# Patient Record
Sex: Male | Born: 1980 | Race: Black or African American | Hispanic: No | Marital: Single | Smoking: Never smoker
Health system: Southern US, Community
[De-identification: ages and names within clinical notes are randomized; demographics above are authoritative.]

## PROBLEM LIST (undated history)

## (undated) DIAGNOSIS — S069XAA Unspecified intracranial injury with loss of consciousness status unknown, initial encounter: Secondary | ICD-10-CM

---

## 2010-05-27 ENCOUNTER — Emergency Department (HOSPITAL_COMMUNITY): Admission: EM | Admit: 2010-05-27 | Discharge: 2010-05-28 | Payer: Self-pay | Admitting: Nurse Practitioner
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
## 2020-04-06 ENCOUNTER — Ambulatory Visit: Payer: Medicare Other | Attending: Family

## 2020-04-06 DIAGNOSIS — Z23 Encounter for immunization: Secondary | ICD-10-CM

## 2020-04-06 NOTE — Progress Notes (Signed)
   Covid-19 Vaccination Clinic  Name:  Jamieon Lannen    MRN: [id number] DOB: 04-Apr-1981  04/06/2020  Mr. Biela was observed post Covid-19 immunization for 15 minutes without incident. He was provided with Vaccine Information Sheet and instruction to access the V-Safe system.   Mr. Molzahn was instructed to call 911 with any severe reactions post vaccine: Marland Kitchen Difficulty breathing  . Swelling of face and throat  . A fast heartbeat  . A bad rash all over body  . Dizziness and weakness   Immunizations Administered    Name Date Dose VIS Date Route   Moderna COVID-19 Vaccine 04/06/2020  1:14 PM 0.5 mL 11/2019 Intramuscular   Manufacturer: Moderna   Lot: [id number]   NDC: 19914-445-84

## 2021-07-14 ENCOUNTER — Ambulatory Visit (INDEPENDENT_AMBULATORY_CARE_PROVIDER_SITE_OTHER): Payer: Medicare (Managed Care) | Admitting: Orthopedic Surgery

## 2021-07-14 ENCOUNTER — Other Ambulatory Visit: Payer: Self-pay

## 2021-07-14 ENCOUNTER — Encounter: Payer: Self-pay | Admitting: Orthopedic Surgery

## 2021-07-14 DIAGNOSIS — M21372 Foot drop, left foot: Secondary | ICD-10-CM

## 2021-07-19 ENCOUNTER — Encounter: Payer: Self-pay | Admitting: Orthopedic Surgery
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
## 2023-03-15 ENCOUNTER — Ambulatory Visit: Payer: Medicare (Managed Care) | Attending: Physical Therapy | Admitting: Physical Therapy
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
## 2023-03-19 ENCOUNTER — Ambulatory Visit: Payer: 59 | Attending: Nurse Practitioner

## 2023-03-19 DIAGNOSIS — R278 Other lack of coordination: Secondary | ICD-10-CM | POA: Diagnosis present

## 2023-03-19 DIAGNOSIS — R293 Abnormal posture: Secondary | ICD-10-CM | POA: Diagnosis present

## 2023-03-19 DIAGNOSIS — R2689 Other abnormalities of gait and mobility: Secondary | ICD-10-CM | POA: Insufficient documentation

## 2023-03-19 DIAGNOSIS — R262 Difficulty in walking, not elsewhere classified: Secondary | ICD-10-CM | POA: Insufficient documentation
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
## 2023-03-26 ENCOUNTER — Ambulatory Visit: Payer: 59 | Admitting: Physical Therapy

## 2023-03-26 ENCOUNTER — Encounter: Payer: Self-pay | Admitting: Physical Therapy

## 2023-03-26 DIAGNOSIS — R293 Abnormal posture: Secondary | ICD-10-CM | POA: Diagnosis not present

## 2023-03-26 DIAGNOSIS — R2689 Other abnormalities of gait and mobility: Secondary | ICD-10-CM

## 2023-03-26 DIAGNOSIS — R262 Difficulty in walking, not elsewhere classified: Secondary | ICD-10-CM

## 2023-03-26 DIAGNOSIS — R278 Other lack of coordination: Secondary | ICD-10-CM

## 2023-03-26 NOTE — Patient Instructions (Signed)
Access Code: [email or web address] URL: [email or web address] Date: 03/26/2023 Prepared by: Camille Bal  Exercises - Sit to Stand with Counter Support  - 1 x daily - 5 x weekly - 3 sets - 10 reps - Seated Long Arc Quad  - 1 x daily - 5 x weekly - 2 sets - 10 reps - 2 seconds hold - Seated Knee Flexion  - 1 x daily - 5 x weekly - 2 sets - 10 reps
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
## 2023-04-02 ENCOUNTER — Ambulatory Visit: Payer: 59 | Admitting: Physical Therapy

## 2023-04-02 DIAGNOSIS — R293 Abnormal posture: Secondary | ICD-10-CM

## 2023-04-02 DIAGNOSIS — R262 Difficulty in walking, not elsewhere classified: Secondary | ICD-10-CM

## 2023-04-02 DIAGNOSIS — R2689 Other abnormalities of gait and mobility: Secondary | ICD-10-CM
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
## 2023-04-09 ENCOUNTER — Ambulatory Visit: Payer: 59

## 2023-04-09 DIAGNOSIS — R278 Other lack of coordination: Secondary | ICD-10-CM

## 2023-04-09 DIAGNOSIS — R293 Abnormal posture: Secondary | ICD-10-CM | POA: Diagnosis not present

## 2023-04-09 DIAGNOSIS — R262 Difficulty in walking, not elsewhere classified: Secondary | ICD-10-CM

## 2023-04-09 DIAGNOSIS — R2689 Other abnormalities of gait and mobility: Secondary | ICD-10-CM
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
## 2023-04-16 ENCOUNTER — Ambulatory Visit: Payer: 59 | Admitting: Physical Therapy

## 2023-04-16 DIAGNOSIS — R293 Abnormal posture: Secondary | ICD-10-CM | POA: Diagnosis not present

## 2023-04-16 DIAGNOSIS — R2689 Other abnormalities of gait and mobility: Secondary | ICD-10-CM

## 2023-04-16 DIAGNOSIS — R262 Difficulty in walking, not elsewhere classified: Secondary | ICD-10-CM

## 2023-04-16 DIAGNOSIS — R278 Other lack of coordination: Secondary | ICD-10-CM
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
## 2023-04-23 ENCOUNTER — Ambulatory Visit: Payer: 59 | Attending: Nurse Practitioner

## 2023-04-23 DIAGNOSIS — R293 Abnormal posture: Secondary | ICD-10-CM | POA: Insufficient documentation

## 2023-04-23 DIAGNOSIS — R2689 Other abnormalities of gait and mobility: Secondary | ICD-10-CM

## 2023-04-23 DIAGNOSIS — R278 Other lack of coordination: Secondary | ICD-10-CM | POA: Insufficient documentation

## 2023-04-23 DIAGNOSIS — R262 Difficulty in walking, not elsewhere classified: Secondary | ICD-10-CM | POA: Insufficient documentation
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
## 2023-04-30 ENCOUNTER — Ambulatory Visit: Payer: 59 | Admitting: Physical Therapy

## 2023-05-07 ENCOUNTER — Ambulatory Visit: Payer: 59 | Admitting: Physical Therapy

## 2023-05-21 ENCOUNTER — Ambulatory Visit: Payer: 59 | Admitting: Physical Therapy

## 2025-01-22 ENCOUNTER — Emergency Department (HOSPITAL_COMMUNITY)
Admission: EM | Admit: 2025-01-22 | Discharge: 2025-01-22 | Disposition: A | Discharge status: Skilled Nursing Facility | Source: Home / Self Care | Attending: Emergency Medicine | Admitting: Emergency Medicine

## 2025-01-22 ENCOUNTER — Encounter (HOSPITAL_COMMUNITY): Payer: Self-pay | Admitting: *Deleted

## 2025-01-22 ENCOUNTER — Other Ambulatory Visit: Payer: Self-pay

## 2025-01-22 ENCOUNTER — Emergency Department (HOSPITAL_COMMUNITY)

## 2025-01-22 DIAGNOSIS — W19XXXA Unspecified fall, initial encounter: Secondary | ICD-10-CM

## 2025-01-22 DIAGNOSIS — S01111A Laceration without foreign body of right eyelid and periocular area, initial encounter: Secondary | ICD-10-CM

## 2025-01-22 HISTORY — DX: Unspecified intracranial injury with loss of consciousness status unknown, initial encounter: S06.9XAA

## 2025-01-22 MED ORDER — TETANUS-DIPHTH-ACELL PERTUSSIS 5-2-15.5 LF-MCG/0.5 IM SUSP
0.5000 mL | Freq: Once | INTRAMUSCULAR | Status: AC
  Administered 2025-01-22: 0.5 mL via INTRAMUSCULAR
  Filled 2025-01-22: qty 0.5

## 2025-01-22 MED ORDER — LIDOCAINE-EPINEPHRINE (PF) 2 %-1:200000 IJ SOLN
10.0000 mL | Freq: Once | INTRAMUSCULAR | Status: AC
  Administered 2025-01-22: 10 mL
  Filled 2025-01-22: qty 20

## 2025-01-22 NOTE — Discharge Instructions (Signed)
 Keep stitches clean and dry.  Follow-up in 7 days to have sutures removed.  Return to ED if any symptoms worsen including uncontrolled bleeding from the wound, uncontrolled headache, syncopal episode, signs of infection from the wound.

## 2025-01-22 NOTE — ED Provider Notes (Signed)
 " [facility] Provider Note   CSN: [id number] Arrival date & time: 01/22/25  9062     Patient presents with: Justin Wilkerson   Justin Wilkerson is a 44 y.o. male.  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  Patient notes he tripped in his room and hit his head on the sink.  Denies loss of consciousness or any nausea/vomiting.  He is currently staying at [facility].  He notes that the area was still bleeding and was told he may need stitches so sent to the ED.  States he is having minimal pain but otherwise feels normal.  No further complaints.   Fall Pertinent negatives include no chest pain, no headaches and no shortness of breath.       Prior to Admission medications  Not on File    Allergies: Patient has no known allergies.    Review of Systems  Constitutional:  Negative for chills and fever.  Respiratory:  Negative for shortness of breath.   Cardiovascular:  Negative for chest pain.  Skin:  Positive for wound.  Neurological:  Negative for dizziness, syncope and headaches.  All other systems reviewed and are negative.   Updated Vital Signs BP 127/87   Pulse (!) 53   Temp 97.9 F (36.6 C)   Resp 16   Wt 83.9 kg   SpO2 100%   Physical Exam Constitutional:      Appearance: Normal appearance.  HENT:     Head: Normocephalic.     Comments: 1 cm laceration present to the right lateral eye.  Minimal active bleeding.  Steri-Strips in place. Eyes:     Extraocular Movements: Extraocular movements intact.     Pupils: Pupils are equal, round, and reactive to light.     Comments: No pain with EOM.  EOM intact with no difficulty.  Cardiovascular:     Rate and Rhythm: Normal rate.  Pulmonary:     Effort: Pulmonary effort is normal.  Skin:    General: Skin is warm and dry.  Neurological:     Mental Status: He is alert and oriented to  person, place, and time.  Psychiatric:     Comments: At baseline     (all labs ordered are listed, but only abnormal results are displayed) Labs Reviewed - No data to display  EKG: None  Radiology: CT Cervical Spine Wo Contrast Result Date: 01/22/2025 EXAM: CT CERVICAL SPINE WITHOUT CONTRAST 01/22/2025 02:31:00 PM TECHNIQUE: CT of the cervical spine was performed without the administration of intravenous contrast. Multiplanar reformatted images are provided for review. Automated exposure control, iterative reconstruction, and/or weight based adjustment of the mA/kV was utilized to reduce the radiation dose to as low as reasonably achievable. COMPARISON: None available. CLINICAL HISTORY: fall Fall. FINDINGS: BONES AND ALIGNMENT: No acute fracture or traumatic malalignment. There is a metallic spinal stimulator lead noted within the upper thoracic spinal canal extending into the lower cervical spinal canal. The stimulator lead tip is within the left ventral aspect of the spinal canal at the C7-T1 level. DEGENERATIVE CHANGES: Degenerative endplate osteophytes and endplate sclerosis at multiple levels. There is disc space narrowing throughout the cervical spine most pronounced at C7-T1. Disc osteophyte complexes at multiple levels. There is no high grade osseous spinal canal stenosis. Facet arthrosis and no uncovertebral hypertrophy. There is foraminal stenosis at multiple levels throughout  the cervical spine most pronounced at the C4-C5 and C5-C6 levels. SOFT TISSUES: No prevertebral soft tissue swelling. IMPRESSION: 1. No evidence of acute traumatic injury. 2. Metallic spinal stimulator lead within the upper thoracic spinal canal, with the tip at the left ventral aspect of the spinal canal at the C7-T1 level. Electronically signed by: Donnice Mania MD 01/22/2025 03:05 PM EST RP Workstation: [id number]   CT Maxillofacial Wo Contrast Result Date: 01/22/2025 EXAM: CT OF THE FACE WITHOUT CONTRAST 01/22/2025  02:31:00 PM TECHNIQUE: CT of the face was performed without the administration of intravenous contrast. Multiplanar reformatted images are provided for review. Automated exposure control, iterative reconstruction, and/or weight based adjustment of the mA/kV was utilized to reduce the radiation dose to as low as reasonably achievable. COMPARISON: None available. CLINICAL HISTORY: Fall. FINDINGS: FACIAL BONES: Age indeterminate minimally displaced nasal bone fractures. No additional maxillofacial fractures noted. No mandibular dislocation. No suspicious bone lesion. ORBITS: Globes are intact. No acute traumatic injury. No inflammatory change. There is right periorbital soft tissue swelling particularly along the lateral aspect of the orbit. SINUSES AND MASTOIDS: No acute abnormality. SOFT TISSUES: There is additional soft tissue swelling in the right facial soft tissues over the zygomatic arch and maxillary sinus on the right. IMPRESSION: 1. Age indeterminate minimally displaced nasal bone fractures. 2. Right periorbital soft tissue swelling. Additional soft tissue swelling in the right facial soft tissues over the zygomatic arch and maxillary sinus. Electronically signed by: Donnice Mania MD 01/22/2025 02:59 PM EST RP Workstation: [id number]   CT Head Wo Contrast Result Date: 01/22/2025 EXAM: CT HEAD WITHOUT CONTRAST 01/22/2025 02:31:00 PM TECHNIQUE: CT of the head was performed without the administration of intravenous contrast. Automated exposure control, iterative reconstruction, and/or weight based adjustment of the mA/kV was utilized to reduce the radiation dose to as low as reasonably achievable. COMPARISON: 05/27/2010 CLINICAL HISTORY: Fall. FINDINGS: BRAIN AND VENTRICLES: No acute hemorrhage. No evidence of acute infarct. Encephalomalacia in the perisagittal right frontal lobe involving a portion of the right cingulate gyrus as well as the posterior body of the corpus callosum. This finding is suggestive of  a remote infarct which is new since 2011. Bilateral basal ganglia mineralization. No hydrocephalus. No extra-axial collection. No mass effect or midline shift. ORBITS: No acute abnormality. SINUSES: No acute abnormality. SOFT TISSUES AND SKULL: No acute soft tissue abnormality. No skull fracture. IMPRESSION: 1. No acute intracranial abnormality. 2. 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 Electronically signed by: Donnice Mania MD 01/22/2025 02:53 PM EST RP Workstation: [id number]     .Laceration Repair  Date/Time: 01/22/2025 3:54 PM  Performed by: Neysa Thersia RAMAN, PA-C Authorized by: Neysa Thersia RAMAN, PA-C   Consent:    Consent obtained:  Verbal   Consent given by:  Patient   Risks discussed:  Infection Universal protocol:    Patient identity confirmed:  Verbally with patient Anesthesia:    Anesthesia method:  Local infiltration   Local anesthetic:  Lidocaine  2% WITH epi Laceration details:    Location:  Face   Face location:  R eyebrow   Length (cm):  2   Depth (mm):  1 Exploration:    Hemostasis achieved with:  Direct pressure and epinephrine    Contaminated: no   Treatment:    Area cleansed with:  Povidone-iodine   Amount of cleaning:  Standard   Irrigation solution:  Sterile saline   Irrigation volume:  20cc  Irrigation method:  Syringe   Debridement:  None Skin repair:    Repair method:  Sutures   Suture size:  4-0   Suture material:  Prolene   Number of sutures:  2 Approximation:    Approximation:  Close Repair type:    Repair type:  Simple Post-procedure details:    Dressing:  Non-adherent dressing   Procedure completion:  Tolerated well, no immediate complications    Medications Ordered in the ED  lidocaine -EPINEPHrine  (XYLOCAINE  W/EPI) 2 %-1:200000 (PF) injection 10 mL (has no administration in time range)  Tdap (ADACEL ) injection 0.5 mL (has no administration in time  range)                                   Medical Decision Making Patient is a 44 year old male who presents to the ED for a fall and laceration to the right eyebrow that occurred earlier this morning.  Please see detailed HPI above.  On exam patient is alert and in no acute stress.  Physical exam as noted above.  He does have a 1 to 2 cm laceration present to the right lateral eyebrow with Steri-Strips in place.  When Steri-Strips removed, wound is bleeding.  CT of the head, facial bones, cervical spine obtained.  CT of the head and neck are unremarkable.  CT of the facial bones does show an age-indeterminate nasal bone fractures and some periorbital edema on the right eye but otherwise no acute abnormalities.  Patient states he is not having any pain around his nose and did not hit his nose this morning, less concerns for acute fracture.  His EOM are intact, less concerns for entrapment.  Pupils equal, no acute neurologic deficits.  Wound was irrigated thoroughly and repaired with 2 sutures.  Tetanus updated.  Otherwise stable for discharge home today.  Wound care instructions provided.  Advise follow-up in 7 days for suture removal.  Return precautions provided for worsening symptoms.  Amount and/or Complexity of Data Reviewed Radiology: ordered.  Risk Prescription drug management.      Final diagnoses:  Fall, initial encounter  Laceration of right eyebrow, initial encounter    ED Discharge Orders     None          Neysa Thersia RAMAN, [place] 01/22/25 1601  "

## 2025-01-22 NOTE — ED Triage Notes (Signed)
 BIB [facility] from [facility] s/p unwitnessed fall, found on floor, no obvious LOC, A&Ox2 per baseline, h/o TBI. GCS 15. VSS Denies pain or other sx or complaints. ~1.5cm lac noted to R lateral eye/face, approximated well with 2 steri-strips PTA, no active bleeding. Alert, NAD, calm, interactive, resps e/u, speaking in clear complete sentences.

## 2025-01-22 NOTE — ED Notes (Signed)
 [facility] called [facility] about an hour
